# Patient Record
Sex: Female | Born: 2000 | Race: White | Hispanic: No | Marital: Single | Smoking: Never smoker
Health system: Southern US, Community
[De-identification: ages and names within clinical notes are randomized; demographics above are authoritative.]

## PROBLEM LIST (undated history)

## (undated) ENCOUNTER — Inpatient Hospital Stay: Payer: Self-pay

## (undated) DIAGNOSIS — Z789 Other specified health status: Secondary | ICD-10-CM

## (undated) HISTORY — PX: NO PAST SURGERIES: SHX2092

---
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
## 2019-01-21 ENCOUNTER — Emergency Department (HOSPITAL_COMMUNITY): Payer: Medicaid Other

## 2019-01-21 ENCOUNTER — Other Ambulatory Visit: Payer: Self-pay

## 2019-01-21 ENCOUNTER — Inpatient Hospital Stay (HOSPITAL_COMMUNITY)
Admission: EM | Admit: 2019-01-21 | Discharge: 2019-01-23 | DRG: 872 | Disposition: A | Discharge status: Home / Self Care | Payer: Medicaid Other | Attending: Internal Medicine | Admitting: Internal Medicine

## 2019-01-21 DIAGNOSIS — E876 Hypokalemia: Secondary | ICD-10-CM | POA: Diagnosis present

## 2019-01-21 DIAGNOSIS — F32A Depression, unspecified: Secondary | ICD-10-CM | POA: Diagnosis present

## 2019-01-21 DIAGNOSIS — N10 Acute tubulo-interstitial nephritis: Secondary | ICD-10-CM | POA: Diagnosis not present

## 2019-01-21 DIAGNOSIS — Z87440 Personal history of urinary (tract) infections: Secondary | ICD-10-CM

## 2019-01-21 DIAGNOSIS — A419 Sepsis, unspecified organism: Secondary | ICD-10-CM | POA: Diagnosis not present

## 2019-01-21 DIAGNOSIS — Z20828 Contact with and (suspected) exposure to other viral communicable diseases: Secondary | ICD-10-CM | POA: Diagnosis present

## 2019-01-21 DIAGNOSIS — N12 Tubulo-interstitial nephritis, not specified as acute or chronic: Secondary | ICD-10-CM

## 2019-01-21 DIAGNOSIS — Z833 Family history of diabetes mellitus: Secondary | ICD-10-CM

## 2019-01-21 DIAGNOSIS — F329 Major depressive disorder, single episode, unspecified: Secondary | ICD-10-CM | POA: Diagnosis present

## 2019-01-21 LAB — CBC WITH DIFFERENTIAL/PLATELET
Abs Immature Granulocytes: 0.18 10*3/uL — ABNORMAL HIGH (ref 0.00–0.07)
Basophils Absolute: 0.1 10*3/uL (ref 0.0–0.1)
Basophils Relative: 0 %
Eosinophils Absolute: 0 10*3/uL (ref 0.0–0.5)
Eosinophils Relative: 0 %
HCT: 33.7 % — ABNORMAL LOW (ref 36.0–46.0)
Hemoglobin: 11.4 g/dL — ABNORMAL LOW (ref 12.0–15.0)
Immature Granulocytes: 1 %
Lymphocytes Relative: 8 %
Lymphs Abs: 1.9 10*3/uL (ref 0.7–4.0)
MCH: 28.2 pg (ref 26.0–34.0)
MCHC: 33.8 g/dL (ref 30.0–36.0)
MCV: 83.4 fL (ref 80.0–100.0)
Monocytes Absolute: 2.4 10*3/uL — ABNORMAL HIGH (ref 0.1–1.0)
Monocytes Relative: 10 %
Neutro Abs: 18.9 10*3/uL — ABNORMAL HIGH (ref 1.7–7.7)
Neutrophils Relative %: 81 %
Platelets: 423 10*3/uL — ABNORMAL HIGH (ref 150–400)
RBC: 4.04 MIL/uL (ref 3.87–5.11)
RDW: 14.6 % (ref 11.5–15.5)
WBC: 23.5 10*3/uL — ABNORMAL HIGH (ref 4.0–10.5)
nRBC: 0 % (ref 0.0–0.2)

## 2019-01-21 LAB — COMPREHENSIVE METABOLIC PANEL
ALT: 26 U/L (ref 0–44)
AST: 16 U/L (ref 15–41)
Albumin: 3.8 g/dL (ref 3.5–5.0)
Alkaline Phosphatase: 120 U/L (ref 38–126)
Anion gap: 14 (ref 5–15)
BUN: 7 mg/dL (ref 6–20)
CO2: 20 mmol/L — ABNORMAL LOW (ref 22–32)
Calcium: 9 mg/dL (ref 8.9–10.3)
Chloride: 100 mmol/L (ref 98–111)
Creatinine, Ser: 1.14 mg/dL — ABNORMAL HIGH (ref 0.44–1.00)
GFR calc Af Amer: >60 mL/min (ref >60)
GFR calc non Af Amer: >60 mL/min (ref >60)
Glucose, Bld: 116 mg/dL — ABNORMAL HIGH (ref 70–99)
Potassium: 3.1 mmol/L — ABNORMAL LOW (ref 3.5–5.1)
Sodium: 134 mmol/L — ABNORMAL LOW (ref 135–145)
Total Bilirubin: 1 mg/dL (ref 0.3–1.2)
Total Protein: 7.6 g/dL (ref 6.5–8.1)

## 2019-01-21 LAB — PROTIME-INR
INR: 1.2 (ref 0.8–1.2)
Prothrombin Time: 15.1 seconds (ref 11.4–15.2)

## 2019-01-21 LAB — URINALYSIS, ROUTINE W REFLEX MICROSCOPIC
Bilirubin Urine: NEGATIVE
Glucose, UA: NEGATIVE mg/dL
Ketones, ur: 20 mg/dL — AB
Nitrite: NEGATIVE
Protein, ur: 100 mg/dL — AB
Specific Gravity, Urine: 1.009 (ref 1.005–1.030)
WBC, UA: >50 WBC/hpf — ABNORMAL HIGH (ref 0–5)
pH: 6 (ref 5.0–8.0)

## 2019-01-21 LAB — LACTIC ACID, PLASMA
Lactic Acid, Venous: 1.2 mmol/L (ref 0.5–1.9)
Lactic Acid, Venous: 0.7 mmol/L (ref 0.5–1.9)

## 2019-01-21 LAB — APTT: aPTT: 38 seconds — ABNORMAL HIGH (ref 24–36)

## 2019-01-21 LAB — I-STAT BETA HCG BLOOD, ED (MC, WL, AP ONLY): I-stat hCG, quantitative: <5 m[IU]/mL (ref <5)

## 2019-01-21 MED ORDER — SODIUM CHLORIDE 0.9 % IV BOLUS
1000.0000 mL | Freq: Once | INTRAVENOUS | Status: AC
  Administered 2019-01-21: 20:00:00 1000 mL via INTRAVENOUS

## 2019-01-21 MED ORDER — POTASSIUM CHLORIDE 10 MEQ/100ML IV SOLN
10.0000 meq | Freq: Once | INTRAVENOUS | Status: AC
  Administered 2019-01-21: 21:00:00 10 meq via INTRAVENOUS
  Filled 2019-01-21: qty 100

## 2019-01-21 MED ORDER — SODIUM CHLORIDE 0.9 % IV SOLN
INTRAVENOUS | Status: DC
  Administered 2019-01-21: 23:00:00 via INTRAVENOUS

## 2019-01-21 MED ORDER — HEPARIN SODIUM (PORCINE) 5000 UNIT/ML IJ SOLN
5000.0000 [IU] | Freq: Three times a day (TID) | INTRAMUSCULAR | Status: DC
  Administered 2019-01-22 – 2019-01-23 (×5): 5000 [IU] via SUBCUTANEOUS
  Filled 2019-01-21 (×5): qty 1

## 2019-01-21 MED ORDER — ONDANSETRON HCL 4 MG/2ML IJ SOLN: 4.0000 mg | Freq: Three times a day (TID) | INTRAMUSCULAR | Status: DC | PRN

## 2019-01-21 MED ORDER — ACETAMINOPHEN 500 MG PO TABS
1000.0000 mg | ORAL_TABLET | Freq: Once | ORAL | Status: AC
  Administered 2019-01-21: 1000 mg via ORAL
  Filled 2019-01-21: qty 2

## 2019-01-21 MED ORDER — OXYCODONE-ACETAMINOPHEN 5-325 MG PO TABS
1.0000 | ORAL_TABLET | ORAL | Status: DC | PRN
  Administered 2019-01-22: 13:00:00 1 via ORAL
  Filled 2019-01-21: qty 1

## 2019-01-21 MED ORDER — SODIUM CHLORIDE 0.9 % IV BOLUS
500.0000 mL | Freq: Once | INTRAVENOUS | Status: AC
  Administered 2019-01-21: 500 mL via INTRAVENOUS

## 2019-01-21 MED ORDER — ACETAMINOPHEN 325 MG PO TABS
650.0000 mg | ORAL_TABLET | Freq: Four times a day (QID) | ORAL | Status: DC | PRN
  Administered 2019-01-22 – 2019-01-23 (×2): 650 mg via ORAL
  Filled 2019-01-21 (×2): qty 2

## 2019-01-21 MED ORDER — SODIUM CHLORIDE 0.9 % IV BOLUS: 1000.0000 mL | Freq: Once | INTRAVENOUS | Status: DC

## 2019-01-21 MED ORDER — SODIUM CHLORIDE 0.9 % IV SOLN: 1.0000 g | INTRAVENOUS | Status: DC

## 2019-01-21 MED ORDER — SODIUM CHLORIDE 0.9 % IV SOLN
1.0000 g | INTRAVENOUS | Status: DC
  Administered 2019-01-21 – 2019-01-22 (×2): 1 g via INTRAVENOUS
  Filled 2019-01-21 (×4): qty 10

## 2019-01-21 MED ORDER — ONDANSETRON HCL 4 MG/2ML IJ SOLN
4.0000 mg | Freq: Once | INTRAMUSCULAR | Status: AC
  Administered 2019-01-21: 20:00:00 4 mg via INTRAVENOUS
  Filled 2019-01-21: qty 2

## 2019-01-21 MED ORDER — MORPHINE SULFATE (PF) 2 MG/ML IV SOLN: 2.0000 mg | INTRAVENOUS | Status: DC | PRN

## 2019-01-21 MED ORDER — POTASSIUM CHLORIDE CRYS ER 20 MEQ PO TBCR
40.0000 meq | EXTENDED_RELEASE_TABLET | Freq: Once | ORAL | Status: AC
  Administered 2019-01-21: 23:00:00 40 meq via ORAL
  Filled 2019-01-21: qty 2

## 2019-01-21 MED ORDER — ACETAMINOPHEN 650 MG RE SUPP: 650.0000 mg | Freq: Four times a day (QID) | RECTAL | Status: DC | PRN

## 2019-01-21 MED ORDER — FENTANYL CITRATE (PF) 100 MCG/2ML IJ SOLN: 50.0000 ug | Freq: Once | INTRAMUSCULAR | Status: DC

## 2019-01-21 NOTE — ED Provider Notes (Addendum)
[facility]   CSN: [id number] Arrival date & time: 01/21/19  1840     History   Chief Complaint Chief Complaint  Patient presents with   Flank Pain    HPI [name] is a 18 y.o. female with no significant past medical history who presents to the ED due to a sudden onset of left flank pain x 3 days. She notes she had dysuria for a few days which gradually progressed to left flank pain that radiates to LLQ. Pain is associated with nausea. Pain is worse when urinating and laying on her side. She has a history of chronic UTIs when she was pregnant. Her last UTI was roughly 1 year ago when she gave birth to her daughter. She denies history of kidney stones. Patient's LMP was 12/10/2018. She is sexually active with 1 partner. Patient denies vaginal discharge, vaginal bleeding, shortness of breath, chest pain, edema, headache, vomiting, and diarrhea.   Past Medical History:  Diagnosis Date   Medical history non-contributory     Patient Active Problem List   Diagnosis Date Noted   Acute pyelonephritis 01/21/2019   Sepsis (HCC) 01/21/2019   Hypokalemia 01/21/2019   Depression 01/21/2019   Postpartum depression 02/01/2018   Postpartum care following vaginal delivery 12/11/2017    Past Surgical History:  Procedure Laterality Date   NO PAST SURGERIES       OB History    Gravida  1   Para  1   Term      Preterm  1   AB      Living  1     SAB      TAB      Ectopic      Multiple  0   Live Births  1            Home Medications    Prior to Admission medications   Medication Sig Start Date End Date Taking? Authorizing Provider  acetaminophen (TYLENOL) 500 MG tablet Take 1,000 mg by mouth every 6 (six) hours as needed for moderate pain.   Yes [provider]  Fe Fum-Fe Poly-Vit C-Lactobac (FUSION) 65-65-25-30 MG CAPS Take 1 tablet by mouth daily. Patient not taking: Reported on  01/21/2019 12/11/17   Farrel ConnersGutierrez, Colleen, CNM  ibuprofen (ADVIL,MOTRIN) 600 MG tablet Take 1 tablet (600 mg total) by mouth every 6 (six) hours as needed for mild pain, moderate pain or cramping. Patient not taking: Reported on 02/01/2018 12/11/17   Farrel ConnersGutierrez, Colleen, CNM  norethindrone (MICRONOR,CAMILA,ERRIN) 0.35 MG tablet Take 1 tablet (0.35 mg total) by mouth daily. Patient not taking: Reported on 01/21/2019 01/03/18   Farrel ConnersGutierrez, Colleen, CNM  sertraline (ZOLOFT) 50 MG tablet Take 1 tablet (50 mg total) by mouth daily. Patient not taking: Reported on 01/21/2019 02/01/18   Oswaldo ConroySchmid, Jacelyn Y, CNM    Family History Family History  Problem Relation Age of Onset   Diabetes Mother    ADD / ADHD Mother    Diabetes Maternal Grandmother     Social History Social History   Tobacco Use   Smoking status: Never Smoker   Smokeless tobacco: Never Used  Substance Use Topics   Alcohol use: Never    Frequency: Never   Drug use: Never     Allergies   Patient has no known allergies.   Review of Systems Review of Systems  Constitutional: Positive for chills and fever.  Respiratory: Negative for shortness of breath.   Cardiovascular:  Negative for chest pain.  Gastrointestinal: Positive for abdominal pain and nausea. Negative for constipation, diarrhea and vomiting.  Genitourinary: Positive for dysuria and flank pain.  All other systems reviewed and are negative.    Physical Exam Updated Vital Signs BP 108/79 (BP Location: Left Arm)    Pulse (!) 153    Temp (!) 102.1 F (38.9 C) (Oral)    Resp 15    SpO2 98%   Physical Exam Constitutional:      General: She is not in acute distress.    Appearance: She is ill-appearing.     Comments: pale  HENT:     Head: Normocephalic.  Eyes:     Pupils: Pupils are equal, round, and reactive to light.  Neck:     Musculoskeletal: Neck supple.  Cardiovascular:     Rate and Rhythm: Regular rhythm. Tachycardia present.     Pulses: Normal  pulses.     Heart sounds: Normal heart sounds. No murmur. No friction rub. No gallop.   Pulmonary:     Effort: Pulmonary effort is normal.     Breath sounds: Normal breath sounds. No stridor. No wheezing or rales.     Comments: Clear to auscultation bilaterally Abdominal:     General: Abdomen is flat. Bowel sounds are normal. There is no distension.     Palpations: Abdomen is soft.     Tenderness: There is abdominal tenderness. There is left CVA tenderness. There is no right CVA tenderness.     Comments: Tenderness to palpation in LLQ. No rebound. No guarding  Musculoskeletal: Normal range of motion.  Skin:    General: Skin is warm.     Capillary Refill: Capillary refill takes less than 2 seconds.     Comments: Skin warm to touch. Well perfused throughout.  Neurological:     General: No focal deficit present.     Mental Status: She is alert and oriented to person, place, and time.     Cranial Nerves: No cranial nerve deficit.      ED Treatments / Results  Labs (all labs ordered are listed, but only abnormal results are displayed) Labs Reviewed  URINALYSIS, ROUTINE W REFLEX MICROSCOPIC - Abnormal; Notable for the following components:      Result Value   APPearance CLOUDY (*)    Hgb urine dipstick MODERATE (*)    Ketones, ur 20 (*)    Protein, ur 100 (*)    Leukocytes,Ua LARGE (*)    WBC, UA >50 (*)    Bacteria, UA MANY (*)    All other components within normal limits  APTT - Abnormal; Notable for the following components:   aPTT 38 (*)    All other components within normal limits  CBC WITH DIFFERENTIAL/PLATELET - Abnormal; Notable for the following components:   WBC 23.5 (*)    Hemoglobin 11.4 (*)    HCT 33.7 (*)    Platelets 423 (*)    Neutro Abs 18.9 (*)    Monocytes Absolute 2.4 (*)    Abs Immature Granulocytes 0.18 (*)    All other components within normal limits  COMPREHENSIVE METABOLIC PANEL - Abnormal; Notable for the following components:   Sodium 134 (*)      Potassium 3.1 (*)    CO2 20 (*)    Glucose, Bld 116 (*)    Creatinine, Ser 1.14 (*)    All other components within normal limits  CULTURE, BLOOD (ROUTINE X 2)  CULTURE, BLOOD (ROUTINE X 2)  URINE  CULTURE  SARS CORONAVIRUS 2 (TAT 6-24 HRS)  LACTIC ACID, PLASMA  PROTIME-INR  MAGNESIUM  LACTIC ACID, PLASMA  PROCALCITONIN  HIV ANTIBODY (ROUTINE TESTING W REFLEX)  HIV4GL SAVE TUBE  BASIC METABOLIC PANEL  CBC  I-STAT BETA HCG BLOOD, ED ([facility], [facility], [facility] ONLY)    EKG None  Radiology Dg Chest Port 1 View  Result Date: 01/21/2019 CLINICAL DATA:  Sepsis. Right-sided pain. Fever. EXAM: PORTABLE CHEST 1 VIEW COMPARISON:  None. FINDINGS: Low lung volumes.The cardiomediastinal contours are normal. The lungs are clear. Pulmonary vasculature is normal. No consolidation, pleural effusion, or pneumothorax. No acute osseous abnormalities are seen. IMPRESSION: Low lung volumes without acute abnormality. Electronically Signed   By: Narda Rutherford M.D.   On: 01/21/2019 19:31   Ct Renal Stone Study  Result Date: 01/21/2019 CLINICAL DATA:  Flank pain EXAM: CT ABDOMEN AND PELVIS WITHOUT CONTRAST TECHNIQUE: Multidetector CT imaging of the abdomen and pelvis was performed following the standard protocol without IV contrast. COMPARISON:  None. FINDINGS: Lower chest: The visualized heart size within normal limits. No pericardial fluid/thickening. No hiatal hernia. The visualized portions of the lungs are clear. Hepatobiliary: Although limited due to the lack of intravenous contrast, normal in appearance without gross focal abnormality. Pancreas:  Unremarkable.  No surrounding inflammatory changes. Spleen: Normal in size. Although limited due to the lack of intravenous contrast, normal in appearance. Adrenals/Urinary Tract: Both adrenal glands appear normal. Bilateral extrarenal pelvises are seen. There is mild right pelviectasis and proximal ureterectasis. No renal or collecting system calculi, however are seen.  No bladder calculi are noted. There is question of minimal perinephric stranding seen around the right lower pole. Stomach/Bowel: The stomach, small bowel, and colon are normal in appearance. No inflammatory changes or obstructive findings. The appendix is not well visualized, however there are no definite inflammatory changes in the right lower quadrant. Vascular/Lymphatic: There are no enlarged abdominal or pelvic lymph nodes. No significant gross vascular findings are present. Reproductive: The uterus and adnexa are unremarkable. Other: No evidence of abdominal wall mass or hernia. Musculoskeletal: No acute or significant osseous findings. IMPRESSION: Mild right pelvicaliectasis and proximal ureterectasis with question of mild right lower pole perinephric stranding. No renal or collecting system calculi however are seen. This could be due to mild pyelonephritis or recently passed stone. Limited visualization of the appendix, however no inflammatory changes in the right lower quadrant. Electronically Signed   By: Jonna Clark M.D.   On: 01/21/2019 21:07    Procedures .Critical Care Performed by: Renee Harder, PA-C Authorized by: Renee Harder, PA-C   Critical care provider statement:    Critical care time (minutes):  30   Critical care time was exclusive of:  Separately billable procedures and treating other patients and teaching time   Critical care was necessary to treat or prevent imminent or life-threatening deterioration of the following conditions:  Sepsis   Critical care was time spent personally by me on the following activities:  Discussions with consultants, evaluation of patient's response to treatment, examination of patient, ordering and performing treatments and interventions, ordering and review of laboratory studies, ordering and review of radiographic studies, pulse oximetry, re-evaluation of patient's condition, obtaining history from patient or surrogate and review of old  charts   (including critical care time)  Medications Ordered in ED Medications  cefTRIAXone (ROCEPHIN) 1 g in sodium chloride 0.9 % 100 mL IVPB (0 g Intravenous Stopped 01/21/19 2010)  fentaNYL (SUBLIMAZE) injection 50 mcg (0 mcg Intravenous Hold  01/21/19 2208)  0.9 %  sodium chloride infusion ( Intravenous New Bag/Given 01/21/19 2311)  morphine 2 MG/ML injection 2 mg (has no administration in time range)  oxyCODONE-acetaminophen (PERCOCET/ROXICET) 5-325 MG per tablet 1 tablet (has no administration in time range)  ondansetron (ZOFRAN) injection 4 mg (has no administration in time range)  heparin injection 5,000 Units (has no administration in time range)  acetaminophen (TYLENOL) tablet 650 mg (has no administration in time range)    Or  acetaminophen (TYLENOL) suppository 650 mg (has no administration in time range)  acetaminophen (TYLENOL) tablet 1,000 mg (1,000 mg Oral Given 01/21/19 1937)  sodium chloride 0.9 % bolus 1,000 mL (0 mLs Intravenous Stopped 01/21/19 2101)  sodium chloride 0.9 % bolus 1,000 mL (0 mLs Intravenous Stopped 01/21/19 2257)  ondansetron (ZOFRAN) injection 4 mg (4 mg Intravenous Given 01/21/19 2011)  potassium chloride 10 mEq in 100 mL IVPB (0 mEq Intravenous Stopped 01/21/19 2208)  sodium chloride 0.9 % bolus 500 mL (500 mLs Intravenous New Bag/Given 01/21/19 2311)  potassium chloride SA (KLOR-CON) CR tablet 40 mEq (40 mEq Oral Given 01/21/19 2307)     Initial Impression / Assessment and Plan / ED Course  I have reviewed the triage vital signs and the nursing notes.  Pertinent labs & imaging results that were available during my care of the patient were reviewed by me and considered in my medical decision making (see chart for details).        Jodel Mayhall is a 18 year old female who presents to the ED for an evaluation of left flank pain x 3 days. Patient has been experiencing dysuria prior to the onset of flank pain. Upon arrival, patient is febrile at  102.1 and tachycardic at 153. She is not hypoxic. Normal BP at 108/79. On physical exam, patient appears ill. She is in no acute distress. Left CVA tenderness. LLQ TTP. No rebound. No guarding. Lungs clear to auscultation bilaterally. Code sepsis called. Will order sepsis order set. Will give 2L bolus. Abx started. Zofran ordered for nausea. Suspect pyelonephritis as source of infection vs. Infected renal stone.   CMP significant for hypokalemia at 3.1. Will replete 10 mEq. Elevated creatinine at 1.14. Suspect pre-renal etiology. Will order renal scan to rule out infected renal stone. Leukocytosis of 23.5. Hemoglobin at 11.4, but appears to be a chronic issue. Lactis acid 1.2. CXR with no signs of infection. UA consistent with infectious process- large amounts of leukocytes. CT demonstrated mild right pelvicaliectasis and proximal ureterectasis with questionable mild right lower pole perinephric stranding. Negative for renal stones. EKG with sinus tachycardia and no sinus of ischemia. Suspect sepsis secondary to pyelonephritis.   11:53 PM Re evaluated patient due to soft blood pressures. Patient appears pallor. IVFs still being given. Patient's skin is warm to touch. Good perfusion throughout. MAP normal. Do not feel her blood pressure is due to worsening sepsis. Patient given Fentanyl for pain.  Arthor Captain, PA-C consulted Dr. Clyde Lundborg who agrees to admit patient for sepsis secondary to pyelonephritis.  Final Clinical Impressions(s) / ED Diagnoses   Final diagnoses:  None    ED Discharge Orders    None       Lorelle Formosa 01/21/19 2354    Loren Racer, MD 01/22/19 1620  Critical Care was added to chart on 02/11/2019 at 12:11 AM  Cheek, Vesta Mixer, PA-C    Renee Harder, PA-C 02/10/19 0013    Loren Racer, MD 02/22/19 [phone]

## 2019-01-21 NOTE — H&P (Signed)
History and Physical    Purvis KiltsJasmine Leigh Aggarwal RUE:[id number] DOB: [birth year] DOA: 01/21/2019  Referring MD/NP/PA:   PCP: Patient, No Pcp Per   Patient coming from:  The patient is coming from home.  At baseline, pt is independent for most of ADL.        Chief Complaint: Left flank pain, dysuria, fever, chills  HPI: Purvis KiltsJasmine Leigh Gage is a 18 y.o. female with medical history significant of depression, who presents with a left flank pain, dysuria, fever and chills.  Patient states that she has been having left flank pain in the past 4 days, which is constant, initially 10 out of 10 in severity, currently 4 out of 10 severity, sharp, radiating to the left lower quadrant of abdomen.  She has fever of 102 at home and chills.  Patient reports dysuria, no burning on urination or increased urinary frequency.  No hematuria.  She has nausea, no vomiting or diarrhea.  Denies chest pain, shortness of breath, cough.  No unilateral weakness.  Patient was hypotensive initially with blood pressure 86/63, which improved to 97/54 after giving 1L normal saline in ED.  ED Course: pt was found to have positive urinalysis (cloudy appearance, large amount of leukocyte, many bacteria, WBC.50), WBC 23.5, lactic acid 1.2, INR 1.2, PTT 38, negative pregnancy test, pending COVID-19 test, potassium 3.1, creatinine 1.14, BUN 7, GFR> 60.  Temperature 102.1, tachycardia, tachypnea, oxygen saturation 97% on room air.  Chest x-ray negative.  Patient is placed on Med[facility]urg [facility] for observation.  # CT scan per renal stone protocol: 1. Mild right pelvicaliectasis and proximal ureterectasis with question of mild right lower pole perinephric stranding. No renal or collecting system calculi however are seen. This could be due to mild pyelonephritis or recently passed stone. 2. Limited visualization of the appendix, however no inflammatory changes in the right lower quadrant.  Review of Systems:   General: has fevers, chills, no  body weight gain, has poor appetite, has fatigue HEENT: no blurry vision, hearing changes or sore throat Respiratory: no dyspnea, coughing, wheezing CV: no chest pain, no palpitations GI: has nausea, no vomiting, abdominal pain, diarrhea, constipation GU: has dysuria, no burning on urination, increased urinary frequency, hematuria. Has left flank pain. Ext: no leg edema Neuro: no unilateral weakness, numbness, or tingling, no vision change or hearing loss Skin: no rash, no skin tear. MSK: No muscle spasm, no deformity, no limitation of range of movement in spin Heme: No easy bruising.  Travel history: No recent long distant travel.  Allergy: No Known Allergies  Past Medical History:  Diagnosis Date  . Medical history non-contributory     Past Surgical History:  Procedure Laterality Date  . NO PAST SURGERIES      Social History:  reports that she has never smoked. She has never used smokeless tobacco. She reports that she does not drink alcohol or use drugs.  Family History:  Family History  Problem Relation Age of Onset  . Diabetes Mother   . ADD / ADHD Mother   . Diabetes Maternal Grandmother      Prior to Admission medications   Medication Sig Start Date End Date Taking? Authorizing Provider  Fe Fum-Fe Poly-Vit C-Lactobac (FUSION) 65-65-25-30 MG CAPS Take 1 tablet by mouth daily. 12/11/17   Farrel ConnersGutierrez, Colleen, CNM  ibuprofen (ADVIL,MOTRIN) 600 MG tablet Take 1 tablet (600 mg total) by mouth every 6 (six) hours as needed for mild pain, moderate pain or cramping. Patient not taking: Reported on 02/01/2018 12/11/17  Farrel Conners, CNM  norethindrone (MICRONOR,CAMILA,ERRIN) 0.35 MG tablet Take 1 tablet (0.35 mg total) by mouth daily. 01/03/18   Farrel Conners, CNM  Prenatal Vit-Fe Fumarate-FA (PRENATAL MULTIVITAMIN) TABS tablet Take 1 tablet by mouth daily at 12 noon.    [provider]  sertraline (ZOLOFT) 50 MG tablet Take 1 tablet (50 mg total) by mouth daily.  02/01/18   Oswaldo Conroy, CNM    Physical Exam: Vitals:   01/21/19 2115 01/21/19 2117 01/21/19 2130 01/21/19 2145  BP: (!) 86/63  (!) 97/54 108/66  Pulse: (!) 110  (!) 110 (!) 102  Resp: (!) 28  (!) 24 19  Temp:  99.8 F (37.7 C)    TempSrc:  Oral    SpO2: 98%  97% 99%  Weight:      Height:       General: Not in acute distress HEENT:       Eyes: PERRL, EOMI, no scleral icterus.       ENT: No discharge from the ears and nose, no pharynx injection, no tonsillar enlargement.        Neck: No JVD, no bruit, no mass felt. Heme: No neck lymph node enlargement. Cardiac: S1/S2, RRR, No murmurs, No gallops or rubs. Respiratory: Good air movement bilaterally. No rales, wheezing, rhonchi or rubs. GI: Soft, nondistended, nontender, no rebound pain, no organomegaly, BS present. GU: No hematuria. Has left CVA tenderness Ext: No pitting leg edema bilaterally. 2+DP/PT pulse bilaterally. Musculoskeletal: No joint deformities, No joint redness or warmth, no limitation of ROM in spin. Skin: No rashes.  Neuro: Alert, oriented X3, cranial nerves II-XII grossly intact, moves all extremities normally.  Psych: Patient is not psychotic, no suicidal or hemocidal ideation.  Labs on Admission: I have personally reviewed following labs and imaging studies  CBC: Recent Labs  Lab 01/21/19 1919  WBC 23.5*  NEUTROABS 18.9*  HGB 11.4*  HCT 33.7*  MCV 83.4  PLT 423*   Basic Metabolic Panel: Recent Labs  Lab 01/21/19 1919  NA 134*  K 3.1*  CL 100  CO2 20*  GLUCOSE 116*  BUN 7  CREATININE 1.14*  CALCIUM 9.0   GFR: Estimated Creatinine Clearance: 72.3 mL/min (A) (by C-G formula based on SCr of 1.14 mg/dL (H)). Liver Function Tests: Recent Labs  Lab 01/21/19 1919  AST 16  ALT 26  ALKPHOS 120  BILITOT 1.0  PROT 7.6  ALBUMIN 3.8   No results for input(s): LIPASE, AMYLASE in the last 168 hours. No results for input(s): AMMONIA in the last 168 hours. Coagulation Profile: Recent Labs   Lab 01/21/19 1919  INR 1.2   Cardiac Enzymes: No results for input(s): CKTOTAL, CKMB, CKMBINDEX, TROPONINI in the last 168 hours. BNP (last 3 results) No results for input(s): PROBNP in the last 8760 hours. HbA1C: No results for input(s): HGBA1C in the last 72 hours. CBG: No results for input(s): GLUCAP in the last 168 hours. Lipid Profile: No results for input(s): CHOL, HDL, LDLCALC, TRIG, CHOLHDL, LDLDIRECT in the last 72 hours. Thyroid Function Tests: No results for input(s): TSH, T4TOTAL, FREET4, T3FREE, THYROIDAB in the last 72 hours. Anemia Panel: No results for input(s): VITAMINB12, FOLATE, FERRITIN, TIBC, IRON, RETICCTPCT in the last 72 hours. Urine analysis:    Component Value Date/Time   COLORURINE YELLOW 01/21/2019 1919   APPEARANCEUR CLOUDY (A) 01/21/2019 1919   LABSPEC 1.009 01/21/2019 1919   PHURINE 6.0 01/21/2019 1919   GLUCOSEU NEGATIVE 01/21/2019 1919   HGBUR MODERATE (A) 01/21/2019 1919  [place] NEGATIVE 01/21/2019 1919   KETONESUR 20 (A) 01/21/2019 1919   PROTEINUR 100 (A) 01/21/2019 1919   NITRITE NEGATIVE 01/21/2019 1919   LEUKOCYTESUR LARGE (A) 01/21/2019 1919   Sepsis Labs: @LABRCNTIP (procalcitonin:4,lacticidven:4) )No results found for this or any previous visit (from the past 240 hour(s)).   Radiological Exams on Admission: Dg Chest Port 1 View  Result Date: 01/21/2019 CLINICAL DATA:  Sepsis. Right-sided pain. Fever. EXAM: PORTABLE CHEST 1 VIEW COMPARISON:  None. FINDINGS: Low lung volumes.The cardiomediastinal contours are normal. The lungs are clear. Pulmonary vasculature is normal. No consolidation, pleural effusion, or pneumothorax. No acute osseous abnormalities are seen. IMPRESSION: Low lung volumes without acute abnormality. Electronically Signed   By: Keith Rake M.D.   On: 01/21/2019 19:31   Ct Renal Stone Study  Result Date: 01/21/2019 CLINICAL DATA:  Flank pain EXAM: CT ABDOMEN AND PELVIS WITHOUT CONTRAST TECHNIQUE:  Multidetector CT imaging of the abdomen and pelvis was performed following the standard protocol without IV contrast. COMPARISON:  None. FINDINGS: Lower chest: The visualized heart size within normal limits. No pericardial fluid/thickening. No hiatal hernia. The visualized portions of the lungs are clear. Hepatobiliary: Although limited due to the lack of intravenous contrast, normal in appearance without gross focal abnormality. Pancreas:  Unremarkable.  No surrounding inflammatory changes. Spleen: Normal in size. Although limited due to the lack of intravenous contrast, normal in appearance. Adrenals/Urinary Tract: Both adrenal glands appear normal. Bilateral extrarenal pelvises are seen. There is mild right pelviectasis and proximal ureterectasis. No renal or collecting system calculi, however are seen. No bladder calculi are noted. There is question of minimal perinephric stranding seen around the right lower pole. Stomach/Bowel: The stomach, small bowel, and colon are normal in appearance. No inflammatory changes or obstructive findings. The appendix is not well visualized, however there are no definite inflammatory changes in the right lower quadrant. Vascular/Lymphatic: There are no enlarged abdominal or pelvic lymph nodes. No significant gross vascular findings are present. Reproductive: The uterus and adnexa are unremarkable. Other: No evidence of abdominal wall mass or hernia. Musculoskeletal: No acute or significant osseous findings. IMPRESSION: Mild right pelvicaliectasis and proximal ureterectasis with question of mild right lower pole perinephric stranding. No renal or collecting system calculi however are seen. This could be due to mild pyelonephritis or recently passed stone. Limited visualization of the appendix, however no inflammatory changes in the right lower quadrant. Electronically Signed   By: Prudencio Pair M.D.   On: 01/21/2019 21:07     EKG: Independently reviewed.  Sinus rhythm, QTC  474, tachycardia, nonspecific T wave change.  Assessment/Plan Principal Problem:   Acute pyelonephritis Active Problems:   Sepsis ([place])   Hypokalemia   Depression   Sepsis due to  acute pyelonephritis: -  Place on med-surg bed for obs -  Ceftriaxone by IV - Follow up results of urine and blood cx and amend antibiotic regimen if needed per sensitivity results - prn Zofran for nausea, percocet and morphine for pain - will get Procalcitonin and trend lactic acid levels per sepsis protocol. - IVF: 2.5L of NS bolus in ED, followed by 125 cc/h   Hypokalemia: K= 3.1 on admission. - Repleted - Check Mg level  Depression: not taking meds now. No SI or HI. No depressed mood. -observe   DVT ppx: SQ Heparin  Code Status: Full code Family Communication: None at bed side.     Disposition Plan:  Anticipate discharge back to previous home environment Consults called:  none Admission status: medical  floor/obs  Date of Service 01/21/2019    Lorretta Harp [facility]   If 7PM-7AM, please contact night-coverage [email or web address] Password [facility] 01/21/2019, 10:33 PM

## 2019-01-21 NOTE — ED Triage Notes (Signed)
Pt here for evaluation of right abdominal and flank pain since Tuesday with painful urination. Fever since last night.

## 2019-01-22 ENCOUNTER — Encounter (HOSPITAL_COMMUNITY): Payer: Self-pay

## 2019-01-22 DIAGNOSIS — Z87440 Personal history of urinary (tract) infections: Secondary | ICD-10-CM | POA: Diagnosis not present

## 2019-01-22 DIAGNOSIS — Z833 Family history of diabetes mellitus: Secondary | ICD-10-CM | POA: Diagnosis not present

## 2019-01-22 DIAGNOSIS — Z20828 Contact with and (suspected) exposure to other viral communicable diseases: Secondary | ICD-10-CM | POA: Diagnosis present

## 2019-01-22 DIAGNOSIS — A419 Sepsis, unspecified organism: Secondary | ICD-10-CM | POA: Diagnosis not present

## 2019-01-22 DIAGNOSIS — N10 Acute tubulo-interstitial nephritis: Secondary | ICD-10-CM | POA: Diagnosis present

## 2019-01-22 DIAGNOSIS — F329 Major depressive disorder, single episode, unspecified: Secondary | ICD-10-CM | POA: Diagnosis present

## 2019-01-22 DIAGNOSIS — E876 Hypokalemia: Secondary | ICD-10-CM | POA: Diagnosis present

## 2019-01-22 LAB — BASIC METABOLIC PANEL
Anion gap: 9 (ref 5–15)
BUN: 7 mg/dL (ref 6–20)
CO2: 18 mmol/L — ABNORMAL LOW (ref 22–32)
Calcium: 8.2 mg/dL — ABNORMAL LOW (ref 8.9–10.3)
Chloride: 109 mmol/L (ref 98–111)
Creatinine, Ser: 0.83 mg/dL (ref 0.44–1.00)
GFR calc Af Amer: >60 mL/min (ref >60)
GFR calc non Af Amer: >60 mL/min (ref >60)
Glucose, Bld: 105 mg/dL — ABNORMAL HIGH (ref 70–99)
Potassium: 3.5 mmol/L (ref 3.5–5.1)
Sodium: 136 mmol/L (ref 135–145)

## 2019-01-22 LAB — HIV ANTIBODY (ROUTINE TESTING W REFLEX): HIV Screen 4th Generation wRfx: NONREACTIVE

## 2019-01-22 LAB — CBC
HCT: 29.2 % — ABNORMAL LOW (ref 36.0–46.0)
Hemoglobin: 9.4 g/dL — ABNORMAL LOW (ref 12.0–15.0)
MCH: 27.5 pg (ref 26.0–34.0)
MCHC: 32.2 g/dL (ref 30.0–36.0)
MCV: 85.4 fL (ref 80.0–100.0)
Platelets: 327 10*3/uL (ref 150–400)
RBC: 3.42 MIL/uL — ABNORMAL LOW (ref 3.87–5.11)
RDW: 14.9 % (ref 11.5–15.5)
WBC: 16.6 10*3/uL — ABNORMAL HIGH (ref 4.0–10.5)
nRBC: 0 % (ref 0.0–0.2)

## 2019-01-22 LAB — SARS CORONAVIRUS 2 (TAT 6-24 HRS): SARS Coronavirus 2: NEGATIVE

## 2019-01-22 LAB — PROCALCITONIN: Procalcitonin: 0.46 ng/mL

## 2019-01-22 LAB — MAGNESIUM: Magnesium: 1.7 mg/dL (ref 1.7–2.4)

## 2019-01-22 MED ORDER — SODIUM CHLORIDE 0.9 % IV SOLN
INTRAVENOUS | Status: AC
  Administered 2019-01-22: 15:00:00 via INTRAVENOUS

## 2019-01-22 NOTE — ED Notes (Signed)
Report called  

## 2019-01-22 NOTE — Progress Notes (Signed)
PROGRESS NOTE    Sylvia Blake  ZOX:[id number] DOB: [birth year] DOA: 01/21/2019 PCP: Patient, No Pcp Per  Brief Narrative: Sylvia Blake is a 18 y.o. female with medical history significant of depression, who presented with a left flank pain, dysuria, fever and chills. -Patient was febrile,  hypotensive initially with blood pressure 86/63, which improved to 97/54 after giving 1L normal saline in ED.  ED Course: pt was found to have positive urinalysis, CT with evidence of pyelonephritis, no calculi noted   Assessment & Plan:   Sepsis secondary to pyelonephritis -Clinically improving, not back to baseline yet, febrile to 102.8 this morning -Continue IV ceftriaxone and IV fluids today -Follow-up urine cultures -Advance diet  Hypokalemia -Replaced      Depression  DVT prophylaxis: Heparin subcutaneous Code Status: Full code Family Communication: No family at bedside Disposition Plan: Home tomorrow if stable  Consultants:   Procedures:   Antimicrobials:    Subjective: -Feels a little better today, continues to have right-sided flank pain, febrile to 102.8 this morning, positive nausea no further vomiting  Objective: Vitals:   01/22/19 0409 01/22/19 0409 01/22/19 0500 01/22/19 0837  BP:  115/69 103/64 107/70  Pulse:  (!) 126 100 (!) 102  Resp:  19 17   Temp:  100.1 F (37.8 C) 99.6 F (37.6 C) 98.4 F (36.9 C)  TempSrc:  Oral Oral Oral  SpO2:  99% 97%   Weight: 76.8 kg     Height: 5\' 1"  (1.549 m)       Intake/Output Summary (Last 24 hours) at 01/22/2019 1121 Last data filed at 01/22/2019 0426 Gross per 24 hour  Intake 3356.25 ml  Output -  Net 3356.25 ml   Filed Weights   01/21/19 1943 01/22/19 0409  Weight: 72.6 kg 76.8 kg    Examination:  General exam: Appears calm and comfortable x3, no distress Respiratory system: Clear to auscultation Cardiovascular system: S1 & S2 heard, RRR.  Gastrointestinal system: Abdomen is nondistended, soft  and nontender.Normal bowel sounds heard. Central nervous system: Alert and oriented. No focal neurological deficits. Extremities: No edema Skin: No rashes, lesions or ulcers Psychiatry:  Mood & affect appropriate.     Data Reviewed:   CBC: Recent Labs  Lab 01/21/19 1919 01/22/19 0417  WBC 23.5* 16.6*  NEUTROABS 18.9*  --   HGB 11.4* 9.4*  HCT 33.7* 29.2*  MCV 83.4 85.4  PLT 423* 327   Basic Metabolic Panel: Recent Labs  Lab 01/21/19 1919 01/22/19 0417  NA 134* 136  K 3.1* 3.5  CL 100 109  CO2 20* 18*  GLUCOSE 116* 105*  BUN 7 7  CREATININE 1.14* 0.83  CALCIUM 9.0 8.2*  MG  --  1.7   GFR: Estimated Creatinine Clearance: 102.2 mL/min (by C-G formula based on SCr of 0.83 mg/dL). Liver Function Tests: Recent Labs  Lab 01/21/19 1919  AST 16  ALT 26  ALKPHOS 120  BILITOT 1.0  PROT 7.6  ALBUMIN 3.8   No results for input(s): LIPASE, AMYLASE in the last 168 hours. No results for input(s): AMMONIA in the last 168 hours. Coagulation Profile: Recent Labs  Lab 01/21/19 1919  INR 1.2   Cardiac Enzymes: No results for input(s): CKTOTAL, CKMB, CKMBINDEX, TROPONINI in the last 168 hours. BNP (last 3 results) No results for input(s): PROBNP in the last 8760 hours. HbA1C: No results for input(s): HGBA1C in the last 72 hours. CBG: No results for input(s): GLUCAP in the last 168 hours. Lipid Profile: No  results for input(s): CHOL, HDL, LDLCALC, TRIG, CHOLHDL, LDLDIRECT in the last 72 hours. Thyroid Function Tests: No results for input(s): TSH, T4TOTAL, FREET4, T3FREE, THYROIDAB in the last 72 hours. Anemia Panel: No results for input(s): VITAMINB12, FOLATE, FERRITIN, TIBC, IRON, RETICCTPCT in the last 72 hours. Urine analysis:    Component Value Date/Time   COLORURINE YELLOW 01/21/2019 1919   APPEARANCEUR CLOUDY (A) 01/21/2019 1919   LABSPEC 1.009 01/21/2019 1919   PHURINE 6.0 01/21/2019 1919   GLUCOSEU NEGATIVE 01/21/2019 1919   HGBUR MODERATE (A)  01/21/2019 [place] NEGATIVE 01/21/2019 1919   KETONESUR 20 (A) 01/21/2019 1919   PROTEINUR 100 (A) 01/21/2019 1919   NITRITE NEGATIVE 01/21/2019 1919   LEUKOCYTESUR LARGE (A) 01/21/2019 1919   Sepsis Labs: @LABRCNTIP (procalcitonin:4,lacticidven:4)  ) Recent Results (from the past 240 hour(s))  Blood Culture (routine x 2)     Status: None (Preliminary result)   Collection Time: 01/21/19  7:19 PM   Specimen: BLOOD  Result Value Ref Range Status   Specimen Description BLOOD LEFT ANTECUBITAL  Final   Special Requests   Final    BOTTLES DRAWN AEROBIC AND ANAEROBIC Blood Culture results may not be optimal due to an excessive volume of blood received in culture bottles   Culture   Final    NO GROWTH < 12 HOURS Performed at [facility], [place] [address] [place], [place] [address]    Report Status PENDING  Incomplete  Blood Culture (routine x 2)     Status: None (Preliminary result)   Collection Time: 01/21/19  7:19 PM   Specimen: BLOOD LEFT HAND  Result Value Ref Range Status   Specimen Description BLOOD LEFT HAND  Final   Special Requests   Final    BOTTLES DRAWN AEROBIC AND ANAEROBIC Blood Culture results may not be optimal due to an excessive volume of blood received in culture bottles   Culture   Final    NO GROWTH < 12 HOURS Performed at [facility], [place] [address] [place], [place] [address]    Report Status PENDING  Incomplete  SARS CORONAVIRUS 2 (TAT 6-24 HRS) Nasopharyngeal Nasopharyngeal Swab     Status: None   Collection Time: 01/21/19  9:11 PM   Specimen: Nasopharyngeal Swab  Result Value Ref Range Status   SARS Coronavirus 2 NEGATIVE NEGATIVE Final    Comment: (NOTE) SARS-CoV-2 target nucleic acids are NOT DETECTED. The SARS-CoV-2 RNA is generally detectable in upper and lower respiratory specimens during the acute phase of infection. Negative results do not preclude SARS-CoV-2 infection, do not rule out co-infections with other pathogens,  and should not be used as the sole basis for treatment or other patient management decisions. Negative results must be combined with clinical observations, patient history, and epidemiological information. The expected result is Negative. Fact Sheet for Patients: SugarRoll.be Fact Sheet for Healthcare Providers: https://www.woods-mathews.com/ This test is not yet approved or cleared by the [place] FDA and  has been authorized for detection and/or diagnosis of SARS-CoV-2 by FDA under an Emergency Use Authorization (EUA). This EUA will remain  in effect (meaning this test can be used) for the duration of the COVID-19 declaration under Section 56 4(b)(1) of the Act, 21 U.S.C. section 360bbb-3(b)(1), unless the authorization is terminated or revoked sooner. Performed at [facility], [place] [address] [place], [place] [address]          Radiology Studies: Dg Chest Port 1 View  Result Date: 01/21/2019  CLINICAL DATA:  Sepsis. Right-sided pain. Fever. EXAM: PORTABLE CHEST 1 VIEW COMPARISON:  None. FINDINGS: Low lung volumes.The cardiomediastinal contours are normal. The lungs are clear. Pulmonary vasculature is normal. No consolidation, pleural effusion, or pneumothorax. No acute osseous abnormalities are seen. IMPRESSION: Low lung volumes without acute abnormality. Electronically Signed   By: Narda Rutherford M.D.   On: 01/21/2019 19:31   Ct Renal Stone Study  Result Date: 01/21/2019 CLINICAL DATA:  Flank pain EXAM: CT ABDOMEN AND PELVIS WITHOUT CONTRAST TECHNIQUE: Multidetector CT imaging of the abdomen and pelvis was performed following the standard protocol without IV contrast. COMPARISON:  None. FINDINGS: Lower chest: The visualized heart size within normal limits. No pericardial fluid/thickening. No hiatal hernia. The visualized portions of the lungs are clear. Hepatobiliary: Although limited due to the lack of intravenous contrast,  normal in appearance without gross focal abnormality. Pancreas:  Unremarkable.  No surrounding inflammatory changes. Spleen: Normal in size. Although limited due to the lack of intravenous contrast, normal in appearance. Adrenals/Urinary Tract: Both adrenal glands appear normal. Bilateral extrarenal pelvises are seen. There is mild right pelviectasis and proximal ureterectasis. No renal or collecting system calculi, however are seen. No bladder calculi are noted. There is question of minimal perinephric stranding seen around the right lower pole. Stomach/Bowel: The stomach, small bowel, and colon are normal in appearance. No inflammatory changes or obstructive findings. The appendix is not well visualized, however there are no definite inflammatory changes in the right lower quadrant. Vascular/Lymphatic: There are no enlarged abdominal or pelvic lymph nodes. No significant gross vascular findings are present. Reproductive: The uterus and adnexa are unremarkable. Other: No evidence of abdominal wall mass or hernia. Musculoskeletal: No acute or significant osseous findings. IMPRESSION: Mild right pelvicaliectasis and proximal ureterectasis with question of mild right lower pole perinephric stranding. No renal or collecting system calculi however are seen. This could be due to mild pyelonephritis or recently passed stone. Limited visualization of the appendix, however no inflammatory changes in the right lower quadrant. Electronically Signed   By: Jonna Clark M.D.   On: 01/21/2019 21:07        Scheduled Meds: . fentaNYL (SUBLIMAZE) injection  50 mcg Intravenous Once  . heparin  5,000 Units Subcutaneous Q8H   Continuous Infusions: . sodium chloride 125 mL/hr at 01/21/19 2311  . cefTRIAXone (ROCEPHIN)  IV Stopped (01/21/19 2010)     LOS: 0 days    Time spent:    Zannie Cove, MD [facility] Page via [email or web address], password [email or web address] After 7PM please contact night-coverage   01/22/2019, 11:21 AM

## 2019-01-23 LAB — CBC
HCT: 28.5 % — ABNORMAL LOW (ref 36.0–46.0)
Hemoglobin: 9.4 g/dL — ABNORMAL LOW (ref 12.0–15.0)
MCH: 27.9 pg (ref 26.0–34.0)
MCHC: 33 g/dL (ref 30.0–36.0)
MCV: 84.6 fL (ref 80.0–100.0)
Platelets: 331 10*3/uL (ref 150–400)
RBC: 3.37 MIL/uL — ABNORMAL LOW (ref 3.87–5.11)
RDW: 14.7 % (ref 11.5–15.5)
WBC: 7.4 10*3/uL (ref 4.0–10.5)
nRBC: 0 % (ref 0.0–0.2)

## 2019-01-23 LAB — URINE CULTURE: Culture: >=100000 — AB

## 2019-01-23 MED ORDER — CEFDINIR 300 MG PO CAPS: 300.0000 mg | ORAL_CAPSULE | Freq: Two times a day (BID) | ORAL | 0 refills | Status: AC

## 2019-01-23 MED ORDER — SODIUM CHLORIDE 0.9 % IV BOLUS
500.0000 mL | Freq: Once | INTRAVENOUS | Status: AC
  Administered 2019-01-23: 500 mL via INTRAVENOUS

## 2019-01-23 NOTE — Discharge Summary (Signed)
Physician Discharge Summary  Luann Aspinwall TGG:[id number] DOB: [birth year] DOA: 01/21/2019  PCP: Patient, No Pcp Per  Admit date: 01/21/2019 Discharge date: 01/23/2019  Time spent: 35 minutes  Recommendations for Outpatient Follow-up:  PCP in 1 week  Discharge Diagnoses:  Principal Problem:   Acute pyelonephritis Active Problems:   Sepsis (HCC)   Hypokalemia   Depression   Discharge Condition: Stable  Diet recommendation: Regular  Filed Weights   01/21/19 1943 01/22/19 0409  Weight: 72.6 kg 76.8 kg    History of present illness:  Sylvia Leigh Brownis a 18 y.o.femalewith medical history significant ofdepression, who presented with a left flank pain, dysuria, fever and chills. -Patient was febrile,  hypotensive initially with blood pressure 86/63, which improved to 97/54 after giving 1Lnormal saline in ED.   Hospital Course:   Sepsis secondary to pyelonephritis -Resolved, present on admission treated with IV fluids and ceftriaxone, urine culture was polymicrobial, clinically improved back to baseline, tolerating diet at the time of discharge, discharged home on cefdinir for 5 more days  Hypokalemia -Replaced      Depression  Discharge Exam: Vitals:   01/23/19 0820 01/23/19 1224  BP: 108/60 109/65  Pulse: 91 95  Resp: 17 17  Temp: 98 F (36.7 C) 98.5 F (36.9 C)  SpO2: 99%     General: Alert awake oriented x3 Cardiovascular: S1-S2/regular rate rhythm Respiratory: Clear  Discharge Instructions   Discharge Instructions    Activity as tolerated - No restrictions   Complete by: As directed    Diet general   Complete by: As directed      Allergies as of 01/23/2019   No Known Allergies     Medication List    STOP taking these medications   Fusion 65-65-25-30 MG Caps   norethindrone 0.35 MG tablet Commonly known as: MICRONOR   sertraline 50 MG tablet Commonly known as: Zoloft     TAKE these medications   acetaminophen 500 MG  tablet Commonly known as: TYLENOL Take 1,000 mg by mouth every 6 (six) hours as needed for moderate pain.   cefdinir 300 MG capsule Commonly known as: OMNICEF Take 1 capsule (300 mg total) by mouth 2 (two) times daily for 5 days.   ibuprofen 600 MG tablet Commonly known as: ADVIL Take 1 tablet (600 mg total) by mouth every 6 (six) hours as needed for mild pain, moderate pain or cramping.      No Known Allergies    The results of significant diagnostics from this hospitalization (including imaging, microbiology, ancillary and laboratory) are listed below for reference.    Significant Diagnostic Studies: Dg Chest Port 1 View  Result Date: 01/21/2019 CLINICAL DATA:  Sepsis. Right-sided pain. Fever. EXAM: PORTABLE CHEST 1 VIEW COMPARISON:  None. FINDINGS: Low lung volumes.The cardiomediastinal contours are normal. The lungs are clear. Pulmonary vasculature is normal. No consolidation, pleural effusion, or pneumothorax. No acute osseous abnormalities are seen. IMPRESSION: Low lung volumes without acute abnormality. Electronically Signed   By: Narda Rutherford M.D.   On: 01/21/2019 19:31   Ct Renal Stone Study  Result Date: 01/21/2019 CLINICAL DATA:  Flank pain EXAM: CT ABDOMEN AND PELVIS WITHOUT CONTRAST TECHNIQUE: Multidetector CT imaging of the abdomen and pelvis was performed following the standard protocol without IV contrast. COMPARISON:  None. FINDINGS: Lower chest: The visualized heart size within normal limits. No pericardial fluid/thickening. No hiatal hernia. The visualized portions of the lungs are clear. Hepatobiliary: Although limited due to the lack of intravenous contrast, normal in  appearance without gross focal abnormality. Pancreas:  Unremarkable.  No surrounding inflammatory changes. Spleen: Normal in size. Although limited due to the lack of intravenous contrast, normal in appearance. Adrenals/Urinary Tract: Both adrenal glands appear normal. Bilateral extrarenal pelvises  are seen. There is mild right pelviectasis and proximal ureterectasis. No renal or collecting system calculi, however are seen. No bladder calculi are noted. There is question of minimal perinephric stranding seen around the right lower pole. Stomach/Bowel: The stomach, small bowel, and colon are normal in appearance. No inflammatory changes or obstructive findings. The appendix is not well visualized, however there are no definite inflammatory changes in the right lower quadrant. Vascular/Lymphatic: There are no enlarged abdominal or pelvic lymph nodes. No significant gross vascular findings are present. Reproductive: The uterus and adnexa are unremarkable. Other: No evidence of abdominal wall mass or hernia. Musculoskeletal: No acute or significant osseous findings. IMPRESSION: Mild right pelvicaliectasis and proximal ureterectasis with question of mild right lower pole perinephric stranding. No renal or collecting system calculi however are seen. This could be due to mild pyelonephritis or recently passed stone. Limited visualization of the appendix, however no inflammatory changes in the right lower quadrant. Electronically Signed   By: Jonna ClarkBindu  Avutu M.D.   On: 01/21/2019 21:07    Microbiology: Recent Results (from the past 240 hour(s))  Blood Culture (routine x 2)     Status: None (Preliminary result)   Collection Time: 01/21/19  7:19 PM   Specimen: BLOOD  Result Value Ref Range Status   Specimen Description BLOOD LEFT ANTECUBITAL  Final   Special Requests   Final    BOTTLES DRAWN AEROBIC AND ANAEROBIC Blood Culture results may not be optimal due to an excessive volume of blood received in culture bottles   Culture   Final    NO GROWTH 2 DAYS Performed at [facility], [address] [place], [place] [address]    Report Status PENDING  Incomplete  Blood Culture (routine x 2)     Status: None (Preliminary result)   Collection Time: 01/21/19  7:19 PM   Specimen: BLOOD LEFT HAND  Result  Value Ref Range Status   Specimen Description BLOOD LEFT HAND  Final   Special Requests   Final    BOTTLES DRAWN AEROBIC AND ANAEROBIC Blood Culture results may not be optimal due to an excessive volume of blood received in culture bottles   Culture   Final    NO GROWTH 2 DAYS Performed at [facility], [address] [place], [place] [address]    Report Status PENDING  Incomplete  Urine culture     Status: Abnormal   Collection Time: 01/21/19  9:11 PM   Specimen: In/Out Cath Urine  Result Value Ref Range Status   Specimen Description IN/OUT CATH URINE  Final   Special Requests   Final    NONE Performed at [facility], [address] [place], [place] [address]    Culture (A)  Final    >=100,000 COLONIES/mL MULTIPLE SPECIES PRESENT, SUGGEST RECOLLECTION   Report Status 01/23/2019 FINAL  Final  SARS CORONAVIRUS 2 (TAT 6-24 HRS) Nasopharyngeal Nasopharyngeal Swab     Status: None   Collection Time: 01/21/19  9:11 PM   Specimen: Nasopharyngeal Swab  Result Value Ref Range Status   SARS Coronavirus 2 NEGATIVE NEGATIVE Final    Comment: (NOTE) SARS-CoV-2 target nucleic acids are NOT DETECTED. The SARS-CoV-2 RNA is generally detectable in upper and lower respiratory specimens  during the acute phase of infection. Negative results do not preclude SARS-CoV-2 infection, do not rule out co-infections with other pathogens, and should not be used as the sole basis for treatment or other patient management decisions. Negative results must be combined with clinical observations, patient history, and epidemiological information. The expected result is Negative. Fact Sheet for Patients: SugarRoll.be Fact Sheet for Healthcare Providers: https://www.woods-mathews.com/ This test is not yet approved or cleared by the [place] FDA and  has been authorized for detection and/or diagnosis of SARS-CoV-2 by FDA under an Emergency Use  Authorization (EUA). This EUA will remain  in effect (meaning this test can be used) for the duration of the COVID-19 declaration under Section 56 4(b)(1) of the Act, 21 U.S.C. section 360bbb-3(b)(1), unless the authorization is terminated or revoked sooner. Performed at [facility], [place] [address] [place],  [address]      Labs: Basic Metabolic Panel: Recent Labs  Lab 01/21/19 1919 01/22/19 0417  NA 134* 136  K 3.1* 3.5  CL 100 109  CO2 20* 18*  GLUCOSE 116* 105*  BUN 7 7  CREATININE 1.14* 0.83  CALCIUM 9.0 8.2*  MG  --  1.7   Liver Function Tests: Recent Labs  Lab 01/21/19 1919  AST 16  ALT 26  ALKPHOS 120  BILITOT 1.0  PROT 7.6  ALBUMIN 3.8   No results for input(s): LIPASE, AMYLASE in the last 168 hours. No results for input(s): AMMONIA in the last 168 hours. CBC: Recent Labs  Lab 01/21/19 1919 01/22/19 0417 01/23/19 0655  WBC 23.5* 16.6* 7.4  NEUTROABS 18.9*  --   --   HGB 11.4* 9.4* 9.4*  HCT 33.7* 29.2* 28.5*  MCV 83.4 85.4 84.6  PLT 423* 327 331   Cardiac Enzymes: No results for input(s): CKTOTAL, CKMB, CKMBINDEX, TROPONINI in the last 168 hours. BNP: BNP (last 3 results) No results for input(s): BNP in the last 8760 hours.  ProBNP (last 3 results) No results for input(s): PROBNP in the last 8760 hours.  CBG: No results for input(s): GLUCAP in the last 168 hours.     Signed:  Domenic Polite MD.  [facility] 01/23/2019, 3:07 PM

## 2019-01-23 NOTE — Progress Notes (Signed)
Pt discharge education provided at bedside  Pt has all belongings including printed prescription Pt IVs removed, catheters intact  Awaiting pt transportation

## 2019-01-23 NOTE — Progress Notes (Signed)
Pt ambulated off unit with RN  

## 2019-01-26 LAB — CULTURE, BLOOD (ROUTINE X 2)
Culture: NO GROWTH
Culture: NO GROWTH

## 2021-03-01 IMAGING — CT CT RENAL STONE PROTOCOL
2 of 4 series · 15 of 46 positions shown, 17 images · non-contrast
Comparison: None.

CLINICAL DATA: Flank pain

EXAM:
CT ABDOMEN AND PELVIS WITHOUT CONTRAST
TECHNIQUE: Multidetector CT imaging of the abdomen and pelvis was performed
following the standard protocol without IV contrast.

[Series 3: ap without · axial · non-contrast · 0.87mm/px · z∈[+650,+1100]mm · 12 of 102 slices shown, 14 images]
[im 6/102  soft-tissue]
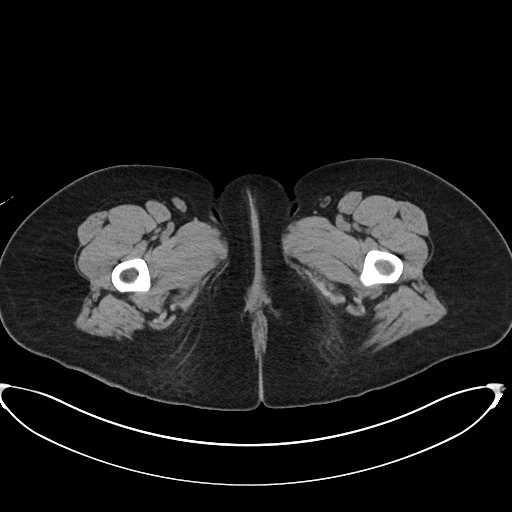
[im 6/102  bone]
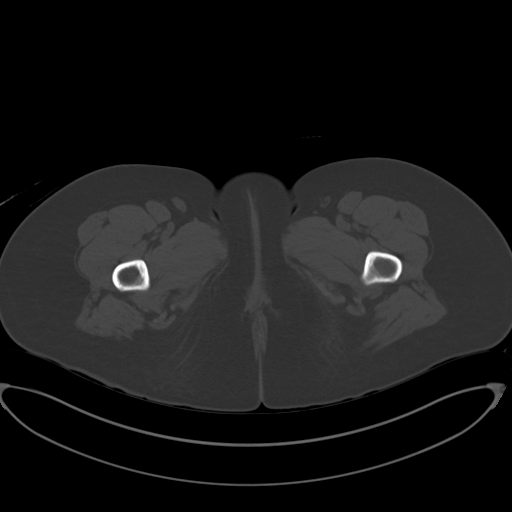
[im 17/102  soft-tissue]
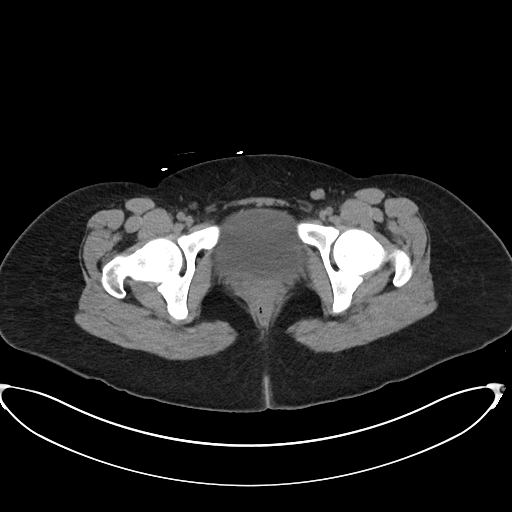
[im 23/102  soft-tissue]
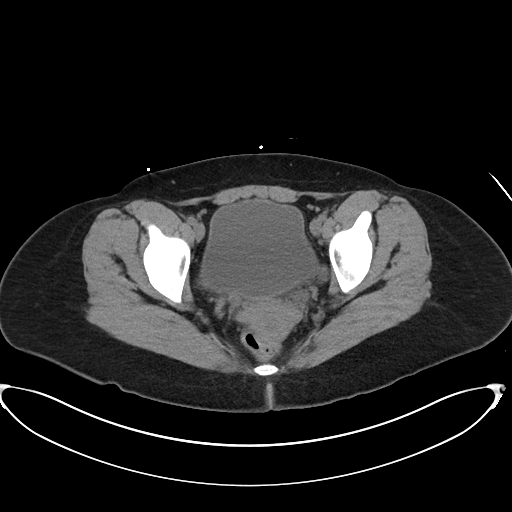
[im 29/102  soft-tissue]
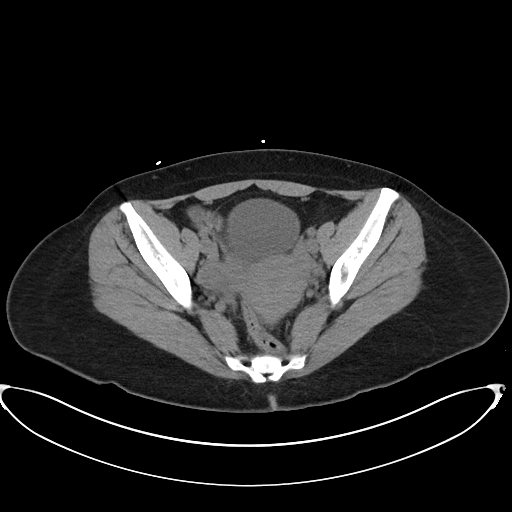
[im 40/102  soft-tissue]
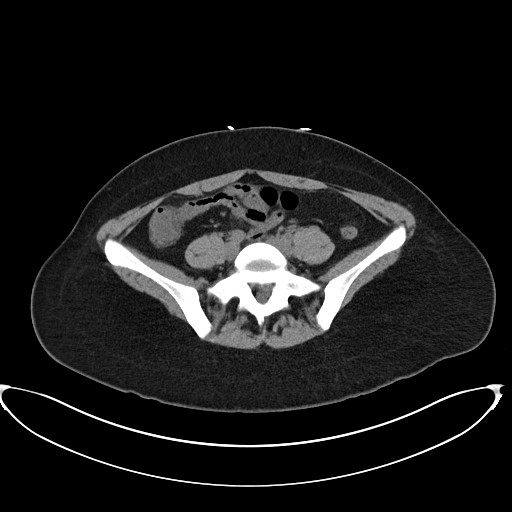
[im 45/102  soft-tissue]
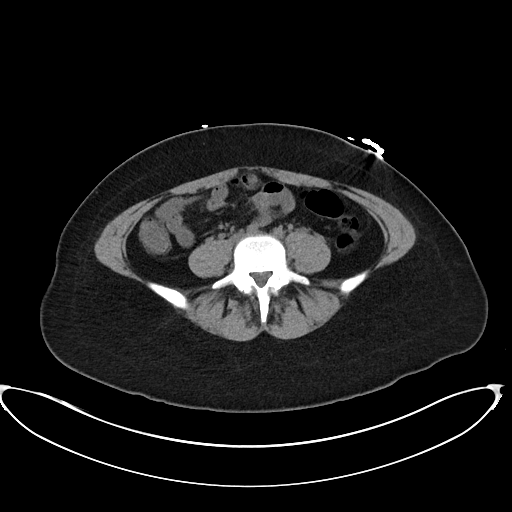
[im 57/102  soft-tissue]
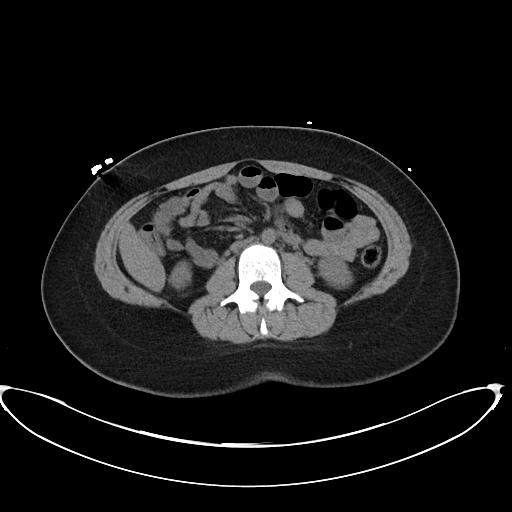
[im 62/102  soft-tissue]
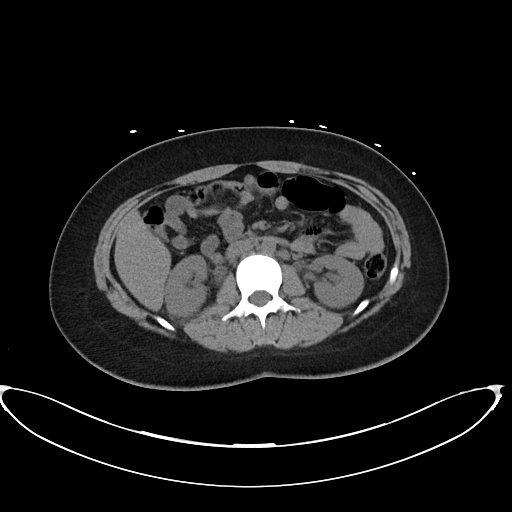
[im 73/102  soft-tissue]
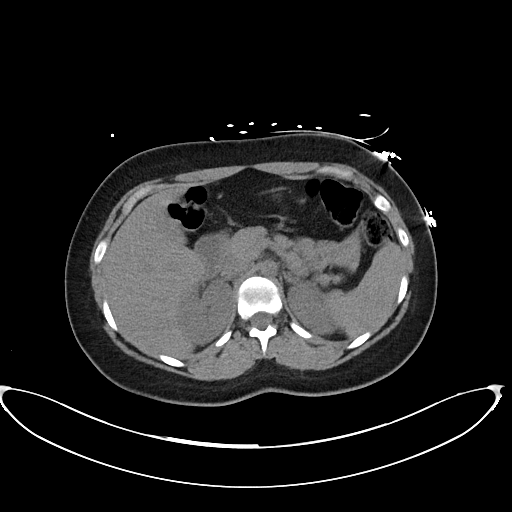
[im 73/102  bone]
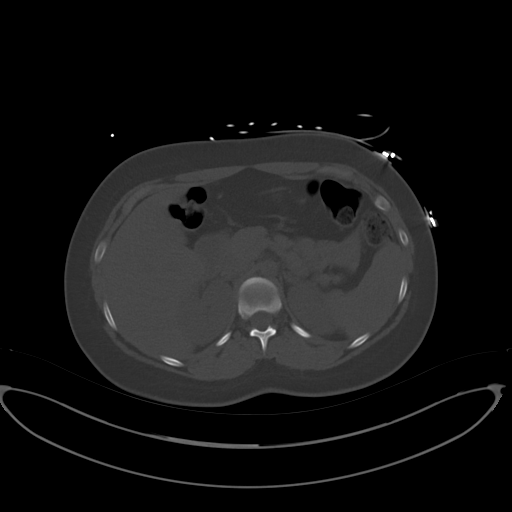
[im 79/102  soft-tissue]
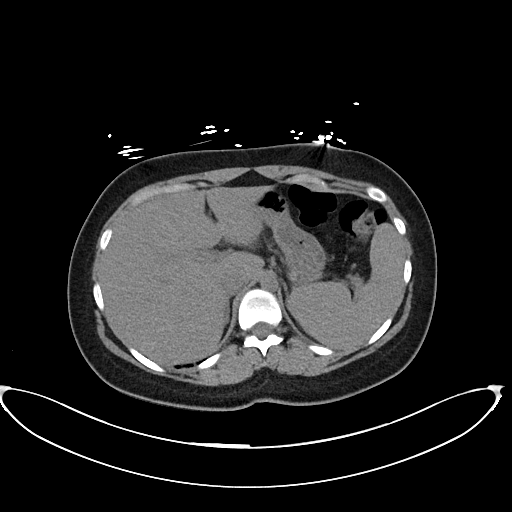
[im 85/102  soft-tissue]
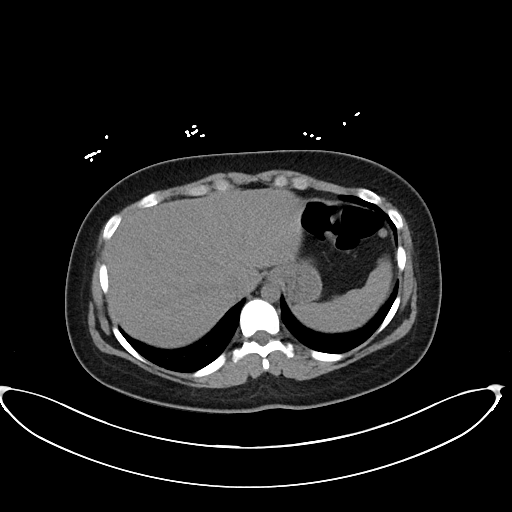
[im 96/102  soft-tissue]
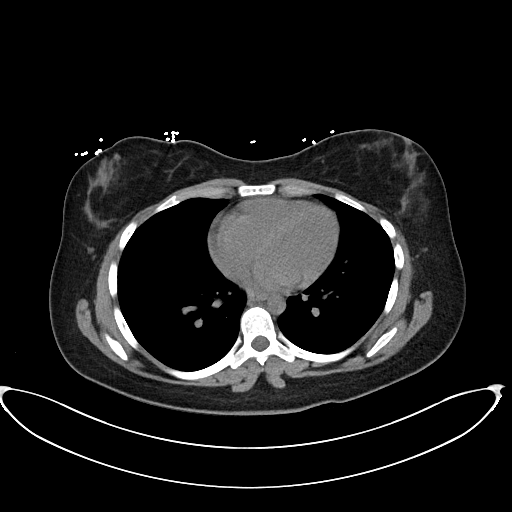

[Series 6: cor · coronal · 0.71mm/px · 3 of 96 slices shown]
[im 32/96  soft-tissue]
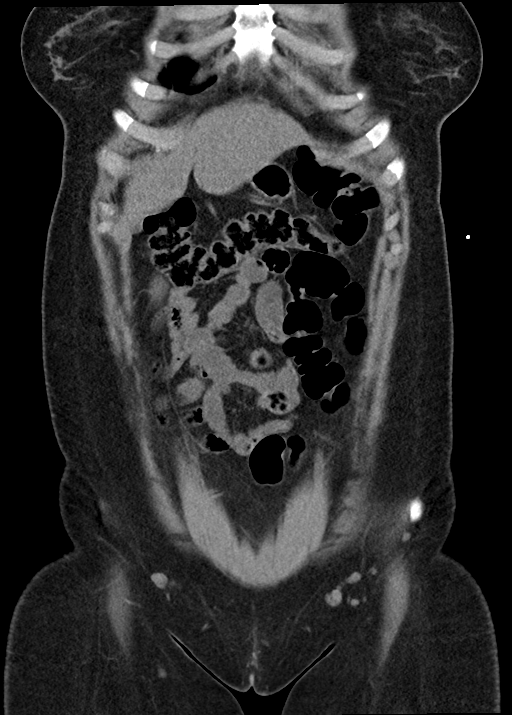
[im 43/96  soft-tissue]
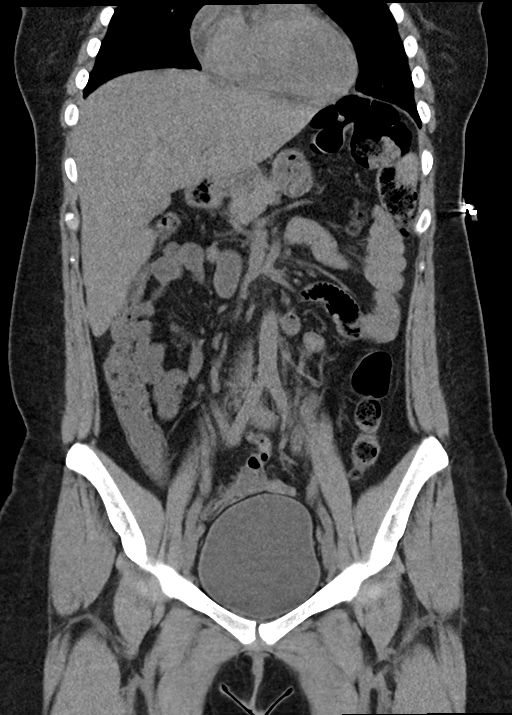
[im 53/96  soft-tissue]
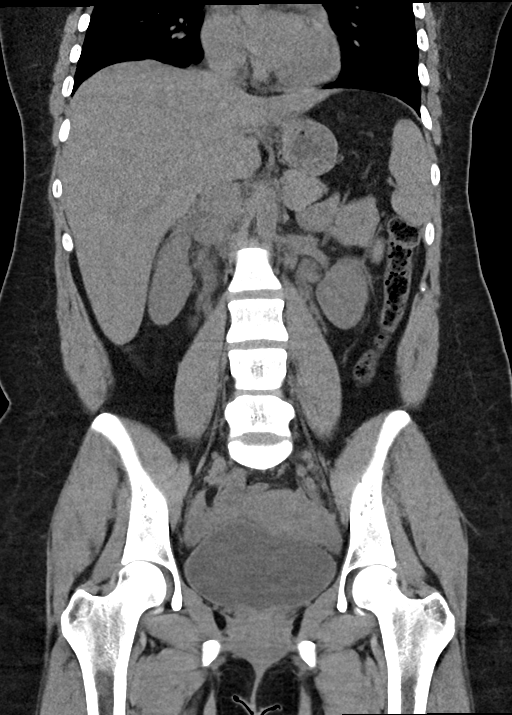

[15 of 46 positions shown; findings below may reference images not displayed]

FINDINGS: Lower chest: The visualized heart size within normal limits. No
pericardial fluid/thickening.

No hiatal hernia.

The visualized portions of the lungs are clear.

Hepatobiliary: Although limited due to the lack of intravenous
contrast, normal in appearance without gross focal abnormality.

Pancreas:  Unremarkable.  No surrounding inflammatory changes.

Spleen: Normal in size. Although limited due to the lack of
intravenous contrast, normal in appearance.

Adrenals/Urinary Tract: Both adrenal glands appear normal. Bilateral
extrarenal pelvises are seen. There is mild right pelviectasis and
proximal ureterectasis. No renal or collecting system calculi,
however are seen. No bladder calculi are noted. There is question of
minimal perinephric stranding seen around the right lower pole.

Stomach/Bowel: The stomach, small bowel, and colon are normal in
appearance. No inflammatory changes or obstructive findings. The
appendix is not well visualized, however there are no definite
inflammatory changes in the right lower quadrant.

Vascular/Lymphatic: There are no enlarged abdominal or pelvic lymph
nodes. No significant gross vascular findings are present.

Reproductive: The uterus and adnexa are unremarkable.

Other: No evidence of abdominal wall mass or hernia.

Musculoskeletal: No acute or significant osseous findings.
IMPRESSION: Mild right pelvicaliectasis and proximal ureterectasis with question
of mild right lower pole perinephric stranding. No renal or
collecting system calculi however are seen. This could be due to
mild pyelonephritis or recently passed stone.

Limited visualization of the appendix, however no inflammatory
changes in the right lower quadrant.
